# Patient Record
Sex: Male | Born: 1985 | Race: Black or African American | Hispanic: No | Marital: Single | Smoking: Current every day smoker
Health system: Southern US, Community
[De-identification: ages and names within clinical notes are randomized; demographics above are authoritative.]

---

## 2010-10-27 ENCOUNTER — Emergency Department: Payer: Self-pay | Admitting: Emergency Medicine

## 2012-07-13 ENCOUNTER — Emergency Department: Payer: Self-pay | Admitting: Emergency Medicine

## 2012-07-13 LAB — CBC
HCT: 46 %
HGB: 15.2 g/dL
MCH: 32.1 pg
MCHC: 33.1 g/dL
MCV: 97 fL
Platelet: 246 x10 3/mm 3
RBC: 4.75 x10 6/mm 3
RDW: 13.1 %
WBC: 4 x10 3/mm 3

## 2012-07-13 LAB — COMPREHENSIVE METABOLIC PANEL WITH GFR
Albumin: 4.2 g/dL
Alkaline Phosphatase: 75 U/L
Anion Gap: 5 — ABNORMAL LOW
BUN: 20 mg/dL — ABNORMAL HIGH
Bilirubin,Total: 0.3 mg/dL
Calcium, Total: 9.1 mg/dL
Chloride: 104 mmol/L
Co2: 27 mmol/L
Creatinine: 0.84 mg/dL
EGFR (African American): >60
EGFR (Non-African Amer.): >60
Glucose: 87 mg/dL
Osmolality: 274
Potassium: 3.8 mmol/L
SGOT(AST): 18 U/L
SGPT (ALT): 19 U/L
Sodium: 136 mmol/L
Total Protein: 7.8 g/dL

## 2012-07-13 LAB — URINALYSIS, COMPLETE
Bilirubin,UR: NEGATIVE
Blood: NEGATIVE
Glucose,UR: NEGATIVE mg/dL
Ketone: NEGATIVE
Leukocyte Esterase: NEGATIVE
Nitrite: NEGATIVE
Ph: 6
Protein: NEGATIVE
RBC,UR: 1 /HPF
Specific Gravity: 1.025
Squamous Epithelial: NONE SEEN
WBC UR: 1 /HPF

## 2012-07-13 LAB — LIPASE, BLOOD: Lipase: 127 U/L

## 2013-07-18 IMAGING — CT CT ABD-PELV W/ CM
1 of 3 series · 14 of 32 positions shown, 19 images · non-contrast
Comparison: none

REASON FOR EXAM: (1) abd pain; (2) abd pain
COMMENTS:

PROCEDURE:     CT  - CT ABDOMEN / PELVIS  W  - July 13, 2012 [DATE]
RESULT:     CT abdomen pelvis dated 07/13/2012.
TECHNIQUE: Helical 3 mm sections were obtained from the lung bases through
the pubic symphysis status post intravenous administration of 100 mL of
Msovue-4EE.

[Series 3: [place] · axial · 0.70mm/px · z∈[-997,-602]mm · 14 of 295 slices shown, 19 images]
[im 16/295  soft-tissue]
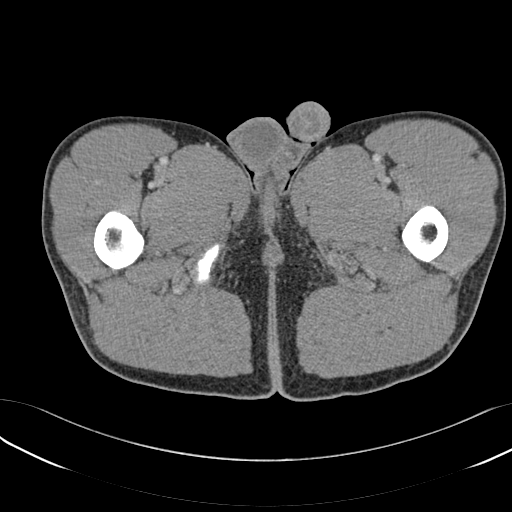
[im 16/295  bone]
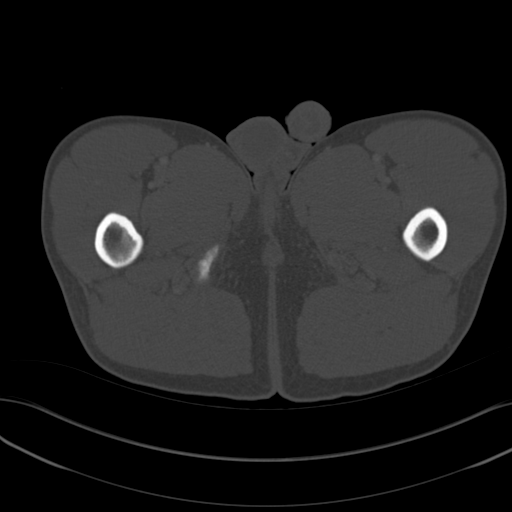
[im 47/295  soft-tissue]
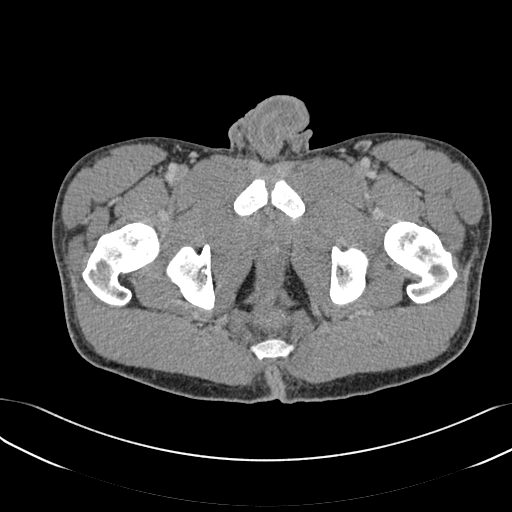
[im 62/295  soft-tissue]
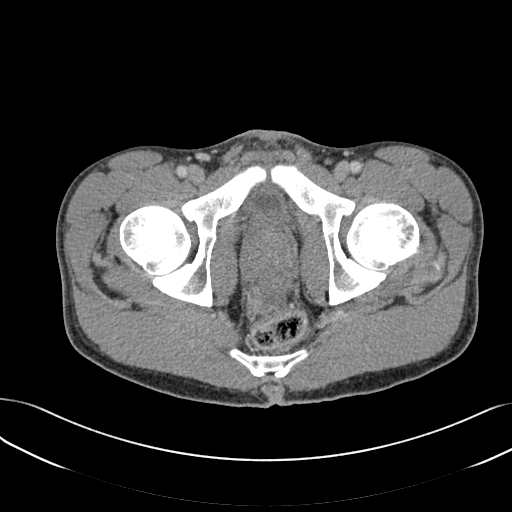
[im 78/295  soft-tissue]
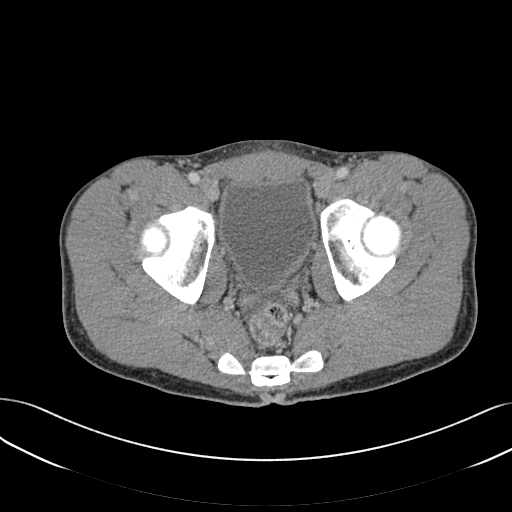
[im 109/295  soft-tissue]
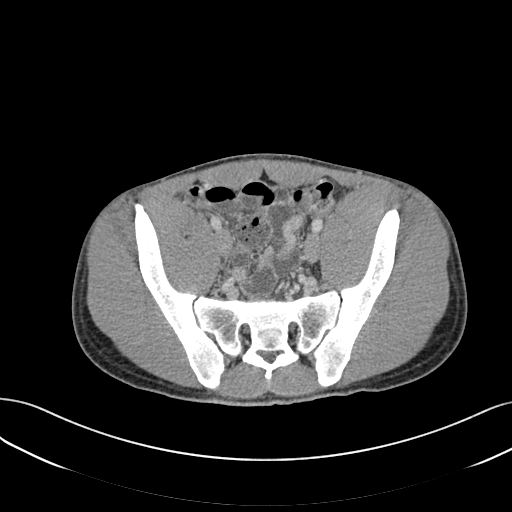
[im 124/295  soft-tissue]
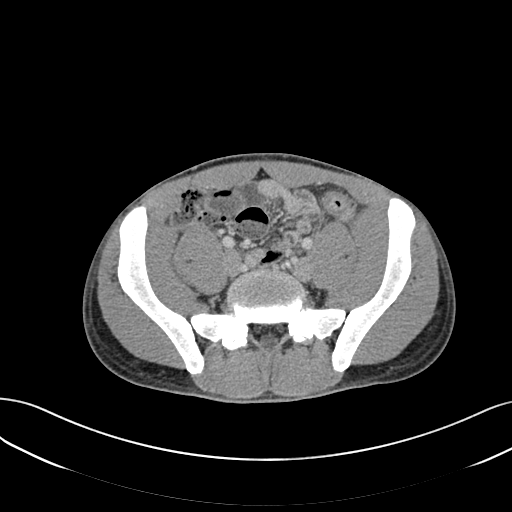
[im 155/295  soft-tissue]
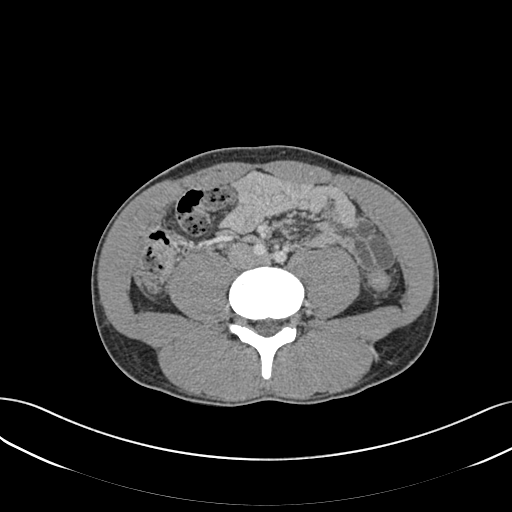
[im 171/295  soft-tissue]
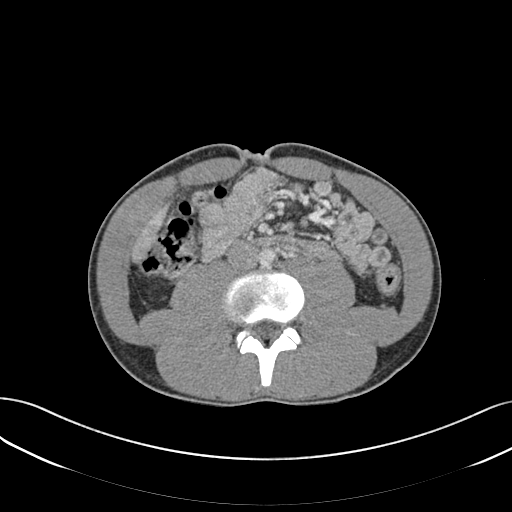
[im 186/295  soft-tissue]
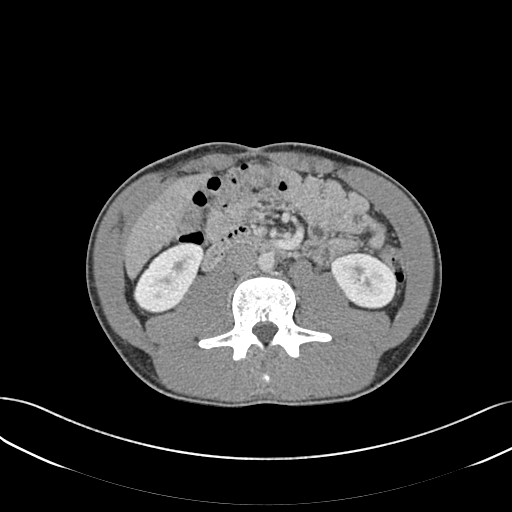
[im 186/295  bone]
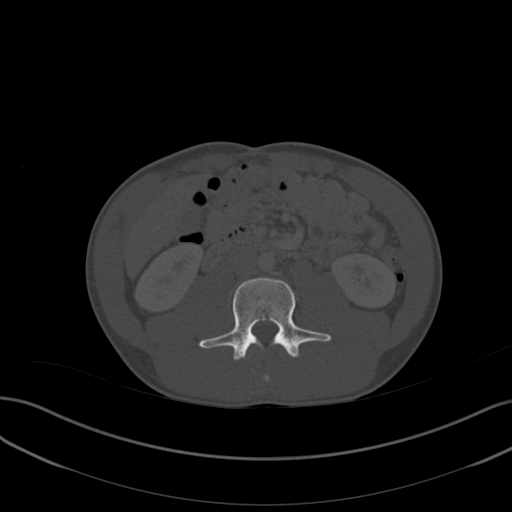
[im 217/295  soft-tissue]
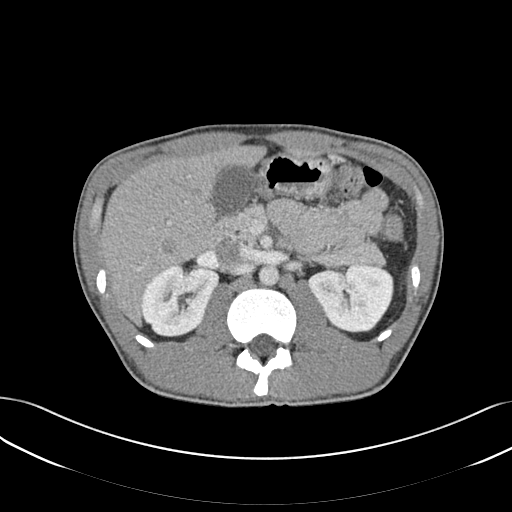
[im 233/295  soft-tissue]
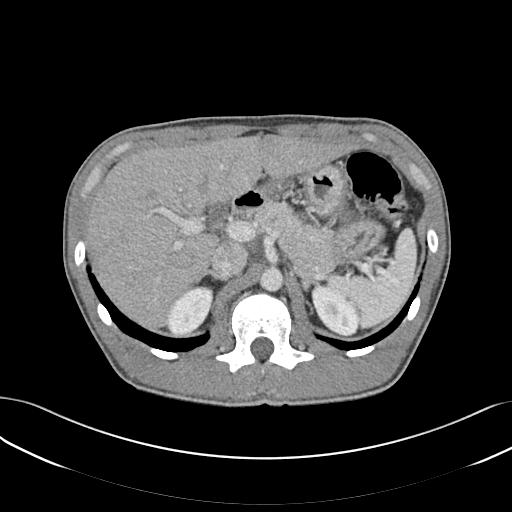
[im 233/295  lung]
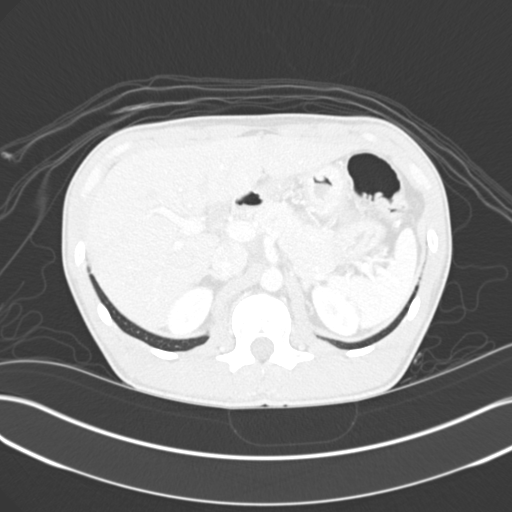
[im 248/295  soft-tissue]
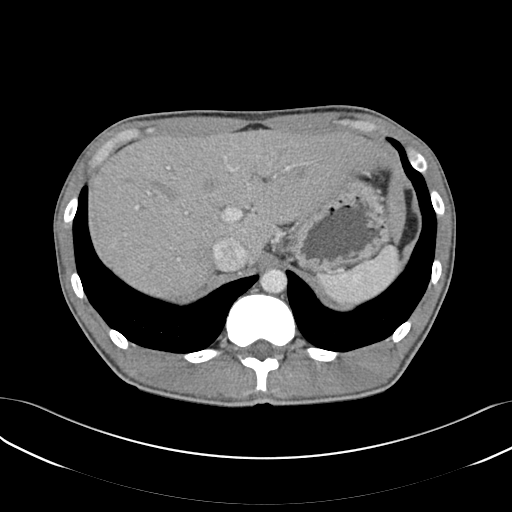
[im 248/295  lung]
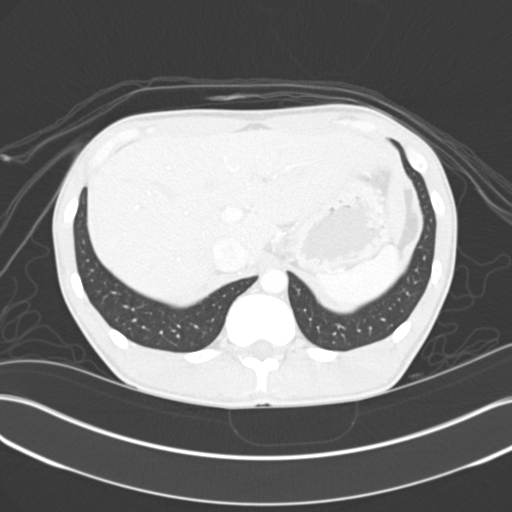
[im 264/295  lung]
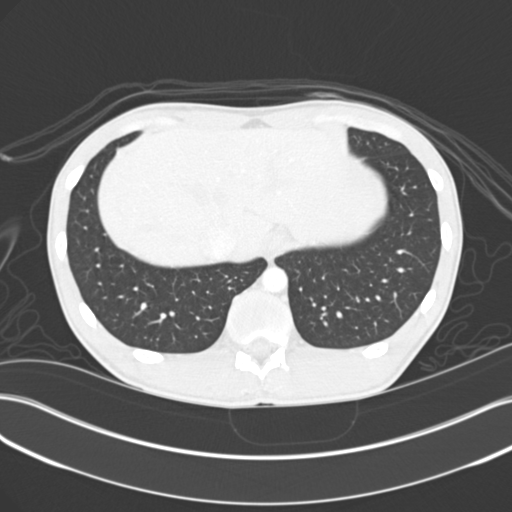
[im 279/295  soft-tissue]
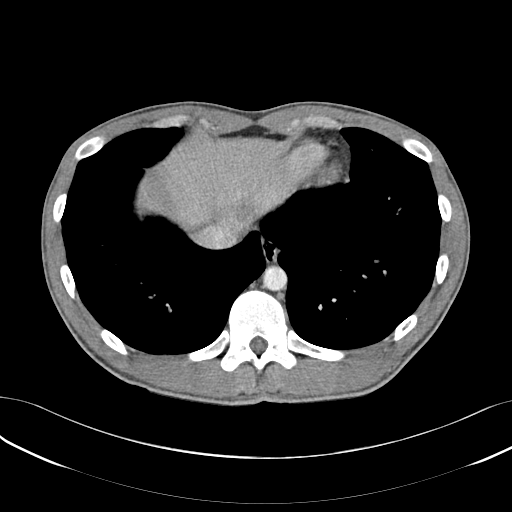
[im 279/295  lung]
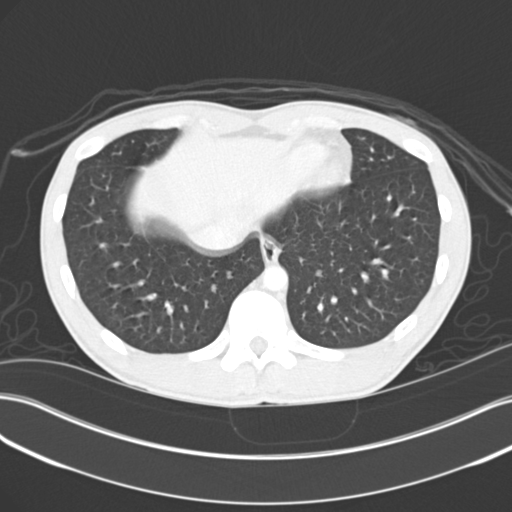

[14 of 32 positions shown; findings below may reference images not displayed]

FINDINGS: The lung bases are unremarkable.

The liver, spleen, adrenals, pancreas, kidneys are unremarkable.

There is no CT evidence of bowel obstruction.

There findings appear to represent mild diffuse bowel wall thickening thin
the descending colon. Component of this finding is likely secondary to
decompression. Mild diverticulosis identified within the descending colon.
There is mild inflammatory stranding within the surrounding mesenteric fat.
There is no evidence of abdominal or pelvic free fluid, loculated fluid
collections, or pneumoperitoneum. There is no evidence of abdominal aortic
aneurysm. The celiac, SMA, IMA, portal vein are opacified.
IMPRESSION: Findings concerning for early versus mild colitis,
nonspecific, involving the descending colon.

## 2022-03-30 ENCOUNTER — Emergency Department
Admission: EM | Admit: 2022-03-30 | Discharge: 2022-03-30 | Disposition: A | Discharge status: Home / Self Care | Payer: Self-pay | Attending: Emergency Medicine | Admitting: Emergency Medicine

## 2022-03-30 ENCOUNTER — Other Ambulatory Visit: Payer: Self-pay

## 2022-03-30 DIAGNOSIS — K029 Dental caries, unspecified: Secondary | ICD-10-CM | POA: Insufficient documentation

## 2022-03-30 MED ORDER — AMOXICILLIN 875 MG PO TABS: 875.0000 mg | ORAL_TABLET | Freq: Two times a day (BID) | ORAL | 0 refills | Status: DC

## 2022-03-30 NOTE — ED Triage Notes (Signed)
R lower dental pain x 4-5 days. Reports known cavity to area and unable to get in with dentist. Denies fevers at home.

## 2022-03-30 NOTE — ED Notes (Signed)
See triage note: pt having dental pain x4 days- pt ambulatory to treatment room, NAD noted

## 2022-03-30 NOTE — ED Provider Notes (Signed)
[facility] Provider Note    Event Date/Time   First MD Initiated Contact with Patient 03/30/22 0715     (approximate)   History   Dental Pain   HPI  Dakota Hendrix is a 36 y.o. male to the ED with complaint of dental pain.  Patient is aware that he has a cavity but states that he is unable to get in to see a dentist.     Physical Exam   Triage Vital Signs: ED Triage Vitals  Enc Vitals Group     BP 03/30/22 0553 118/71     Pulse Rate 03/30/22 0553 64     Resp 03/30/22 0553 18     Temp 03/30/22 0553 98.6 F (37 C)     Temp Source 03/30/22 0553 Oral     SpO2 03/30/22 0553 100 %     Weight 03/30/22 0551 140 lb (63.5 kg)     Height 03/30/22 0551 5\' 6"  (1.676 m)     Head Circumference --      Peak Flow --      Pain Score 03/30/22 0551 10     Pain Loc --      Pain Edu? --      Excl. in [place]? --     Most recent vital signs: Vitals:   03/30/22 0553  BP: 118/71  Pulse: 64  Resp: 18  Temp: 98.6 F (37 C)  SpO2: 100%     General: Awake, no distress.  CV:  Good peripheral perfusion.  Resp:  Normal effort.  Abd:  No distention.  Other:  Right posterior lower molars x 2 and very poor repair and hygiene.  The most proximal molar has large caries present.  The posterior molar is decayed to near the gumline.  No active drainage was noted.   ED Results / Procedures / Treatments   Labs (all labs ordered are listed, but only abnormal results are displayed) Labs Reviewed - No data to display    PROCEDURES:  Critical Care performed:   Procedures   MEDICATIONS ORDERED IN ED: Medications - No data to display   IMPRESSION / MDM / ASSESSMENT AND PLAN / ED COURSE  I reviewed the triage vital signs and the nursing notes.   Differential diagnosis includes, but is not limited to, dental pain, dental abscess, dental carry.  36 year old male presents to the ED with dental pain stating that he is unable to get an appointment with a dentist.   Patient does have 2 posterior molars on the right lower that is in need of repair.  A prescription for amoxicillin 875 twice daily for 10 days was sent to the pharmacy and a list of dental clinics in the area was also printed for him.  He is strongly encouraged to call and make an appointment at 1 of these resources.  He is instructed to take Tylenol or ibuprofen with the antibiotic if needed.      Patient's presentation is most consistent with acute, uncomplicated illness.  FINAL CLINICAL IMPRESSION(S) / ED DIAGNOSES   Final diagnoses:  Pain due to dental caries     Rx / DC Orders   ED Discharge Orders          Ordered    amoxicillin (AMOXIL) 875 MG tablet  2 times daily        03/30/22 0738             Note:  This document was prepared using Dragon voice  recognition software and may include unintentional dictation errors.   Johnn Hai, PA-C 03/30/22 1157    Blake Divine, MD 03/30/22 1451

## 2022-03-30 NOTE — Discharge Instructions (Addendum)
Follow-up with 1 of the dentist listed on your discharge papers. A prescription for antibiotics was sent to the pharmacy for you to begin taking today.  You may take Tylenol or ibuprofen with with this medication if needed and also Orajel over-the-counter for dental pain.  OPTIONS FOR DENTAL FOLLOW UP CARE  [place] [facility] - Local Safety Net Dental Clinics [email or web address]   [facility] [phone])  [facility] [phone])  [place] [phone])  [facility] [phone])  [facility] [phone]) This clinic caters to the indigent population and is on a lottery system. Location: [employer] [facility], [employer], [address] [place] Clinic Hours: Wednesdays from 6pm - 9pm, patients seen by a lottery system. For dates, call or go to [email or web address] Services: Cleanings, fillings and simple extractions. Payment Options: DENTAL WORK IS FREE OF CHARGE. Bring proof of income or support. Best way to get seen: Arrive at 5:15 pm - this is a lottery, NOT first come/first serve, so arriving earlier will not increase your chances of being seen.     [facility] [phone] Select option 1 for emergencies   Location: [facility], [place], [address], [place] Clinic Hours: No walk-ins accepted - call the day before to schedule an appointment. Check in times are 9:30 am and 1:30 pm. Services: Simple extractions, temporary fillings, pulpectomy/pulp debridement, uncomplicated abscess drainage. Payment Options: PAYMENT IS DUE AT THE TIME OF SERVICE.  Fee is usually $100-200, additional surgical procedures (e.g. abscess drainage) may be extra. Cash, checks, Visa/MasterCard accepted.  Can file Medicaid if patient is covered for dental - patient  should call case worker to check. No discount for [facility] patients. Best way to get seen: MUST call the day before and get onto the schedule. Can usually be seen the next 1-2 days. No walk-ins accepted.     [facility] [phone]   Location: [facility], [address], [place] Clinic Hours: M, W, Th, F 8am or 1:30pm, Tues 9a or 1:30 - first come/first served. Services: Simple extractions, temporary fillings, uncomplicated abscess drainage.  You do not need to be an [facility] resident. Payment Options: PAYMENT IS DUE AT THE TIME OF SERVICE. Dental insurance, otherwise sliding scale - bring proof of income or support. Depending on income and treatment needed, cost is usually $50-200. Best way to get seen: Arrive early as it is first come/first served.     [facility] [phone]   Location: [address] Clinic Hours: Mon-Thu 8a-5p Services: Most basic dental services including extractions and fillings. Payment Options: PAYMENT IS DUE AT THE TIME OF SERVICE. Sliding scale, up to 50% off - bring proof if income or support. Medicaid with dental option accepted. Best way to get seen: Call to schedule an appointment, can usually be seen within 2 weeks OR they will try to see walk-ins - show up at 8a or 2p (you may have to wait).     [facility] [phone] [place] RESIDENTS ONLY   Location: [facility], [address], [place], [place] [address] Clinic Hours: By appointment only. Monday - Thursday 8am-5pm, Friday 8am-12pm Services: Cleanings, fillings, extractions. Payment Options: PAYMENT IS DUE AT THE TIME OF SERVICE. Cash, Visa or MasterCard. Sliding scale - $30 minimum per service. Best way to get seen: Come in to office, complete  packet and make an appointment - need proof of income or support monies for each household member  and proof of [facility] residence. Usually takes about a month to get in.     [facility] [phone]   Location: [address], [place] Clinic Hours: Walk-in Urgent Care Dental Services are offered Monday-Friday mornings only. The numbers of emergencies accepted daily is limited to the number of providers available. Maximum 15 - Mondays, Wednesdays & Thursdays Maximum 10 - Tuesdays & Fridays Services: You do not need to be a [facility] resident to be seen for a dental emergency. Emergencies are defined as pain, swelling, abnormal bleeding, or dental trauma. Walkins will receive x-rays if needed. NOTE: Dental cleaning is not an emergency. Payment Options: PAYMENT IS DUE AT THE TIME OF SERVICE. Minimum co-pay is $40.00 for uninsured patients. Minimum co-pay is $3.00 for Medicaid with dental coverage. Dental Insurance is accepted and must be presented at time of visit. Medicare does not cover dental. Forms of payment: Cash, credit card, checks. Best way to get seen: If not previously registered with the clinic, walk-in dental registration begins at 7:15 am and is on a first come/first serve basis. If previously registered with the clinic, call to make an appointment.     [facility] [place] ONLY   Location: [address], [place], [place] Clinic Hours: Mon-Thu 10a-2p Services: Extractions only! Payment Options: FREE (donations accepted) - bring proof of income or support Best way to get seen: Call and schedule an appointment OR come at 8am on the 1st Monday of every month (except for holidays) when it is first come/first served.     [facility] [phone]   Location: [place], [place] Clinic Hours: Friday mornings Services, Payment Options, Best way to get seen: Call for info

## 2022-03-31 ENCOUNTER — Emergency Department
Admission: EM | Admit: 2022-03-31 | Discharge: 2022-03-31 | Disposition: A | Discharge status: Home / Self Care | Payer: Self-pay | Attending: Emergency Medicine | Admitting: Emergency Medicine

## 2022-03-31 ENCOUNTER — Other Ambulatory Visit: Payer: Self-pay

## 2022-03-31 DIAGNOSIS — X58XXXD Exposure to other specified factors, subsequent encounter: Secondary | ICD-10-CM | POA: Insufficient documentation

## 2022-03-31 DIAGNOSIS — S025XXG Fracture of tooth (traumatic), subsequent encounter for fracture with delayed healing: Secondary | ICD-10-CM | POA: Insufficient documentation

## 2022-03-31 DIAGNOSIS — K047 Periapical abscess without sinus: Secondary | ICD-10-CM | POA: Insufficient documentation

## 2022-03-31 MED ORDER — AMOXICILLIN-POT CLAVULANATE 875-125 MG PO TABS: 1.0000 | ORAL_TABLET | Freq: Two times a day (BID) | ORAL | 0 refills | Status: AC

## 2022-03-31 MED ORDER — AMOXICILLIN-POT CLAVULANATE 875-125 MG PO TABS
1.0000 | ORAL_TABLET | Freq: Once | ORAL | Status: AC
  Administered 2022-03-31: 1 via ORAL
  Filled 2022-03-31: qty 1

## 2022-03-31 MED ORDER — HYDROCODONE-ACETAMINOPHEN 5-325 MG PO TABS: 1.0000 | ORAL_TABLET | ORAL | 0 refills | Status: AC | PRN

## 2022-03-31 MED ORDER — KETOROLAC TROMETHAMINE 60 MG/2ML IM SOLN
60.0000 mg | Freq: Once | INTRAMUSCULAR | Status: AC
  Administered 2022-03-31: 60 mg via INTRAMUSCULAR
  Filled 2022-03-31: qty 2

## 2022-03-31 NOTE — ED Provider Notes (Signed)
[facility] Provider Note   Event Date/Time   First MD Initiated Contact with Patient 03/31/22 [phone]     (approximate) History  Dental Pain  HPI Dakota Hendrix is a 36 y.o. male who presents for the second time in 2 days due to demand tubular right molar pain.  Patient states that he was seen and diagnosed with a possible dental infection yesterday and started on antibiotics however patient states that after 3 doses that has not been working.  Patient states that his inflammation and pain has gotten worse over this time.  Patient has tried ibuprofen and Tylenol with no improvement in his pain. ROS: Patient currently denies any vision changes, tinnitus, difficulty speaking, facial droop, sore throat, chest pain, shortness of breath, abdominal pain, nausea/vomiting/diarrhea, dysuria, or weakness/numbness/paresthesias in any extremity   Physical Exam  Triage Vital Signs: ED Triage Vitals  Enc Vitals Group     BP 03/31/22 0545 121/86     Pulse Rate 03/31/22 0545 61     Resp 03/31/22 0545 18     Temp 03/31/22 0545 97.9 F (36.6 C)     Temp src --      SpO2 03/31/22 0545 98 %     Weight --      Height --      Head Circumference --      Peak Flow --      Pain Score 03/31/22 0543 8     Pain Loc --      Pain Edu? --      Excl. in [place]? --    Most recent vital signs: Vitals:   03/31/22 0545  BP: 121/86  Pulse: 61  Resp: 18  Temp: 97.9 F (36.6 C)  SpO2: 98%   General: Awake, oriented x4. CV:  Good peripheral perfusion.  Resp:  Normal effort.  Abd:  No distention.  Other:  Middle-aged African-American male laying in bed in mild distress secondary to pain.  There are dental caries throughout the mouth with a fractured tooth and erythematous gingiva around the back lower right 2 mandibular molars ED Results / Procedures / Treatments  PROCEDURES: Critical Care performed: No Procedures MEDICATIONS ORDERED IN ED: Medications  ketorolac (TORADOL) injection 60  mg (60 mg Intramuscular Given 03/31/22 0624)  amoxicillin-clavulanate (AUGMENTIN) 875-125 MG per tablet 1 tablet (1 tablet Oral Given 03/31/22 2297)   IMPRESSION / MDM / ASSESSMENT AND PLAN / ED COURSE  I reviewed the triage vital signs and the nursing notes.                             Patient's presentation is most consistent with acute presentation with potential threat to life or bodily function. Patient not immunosuppressed. No e/o tooth fracture, avulsion, or bleeding socket. No e/o RPA, PTA, Ludwigs angina, periapical abscess. No e/o gingival hyperplasia or concern for drug reaction.  Rx Norco, Augmentin Disposition: Discharge home. Discussed return precautions for odontogenic infections and other dental pain emergencies. Will provide dental clinic list.   FINAL CLINICAL IMPRESSION(S) / ED DIAGNOSES   Final diagnoses:  Dental infection  Open fracture of tooth with delayed healing, subsequent encounter   Rx / DC Orders   ED Discharge Orders          Ordered    amoxicillin-clavulanate (AUGMENTIN) 875-125 MG tablet  2 times daily        03/31/22 0607    HYDROcodone-acetaminophen (NORCO) 5-325 MG tablet  Every 4 hours PRN        03/31/22 0607           Note:  This document was prepared using Dragon voice recognition software and may include unintentional dictation errors.   Merwyn Katos, MD 03/31/22 [phone]

## 2022-03-31 NOTE — ED Triage Notes (Signed)
Pt was seen last night for dental pain. Sts he was given ABX but needs pain medicine.

## 2022-04-03 ENCOUNTER — Ambulatory Visit: Payer: Self-pay

## 2023-08-08 ENCOUNTER — Other Ambulatory Visit: Payer: Self-pay

## 2023-08-08 ENCOUNTER — Emergency Department
Admission: EM | Admit: 2023-08-08 | Discharge: 2023-08-08 | Discharge status: Against Medical Advice | Payer: Self-pay | Attending: Emergency Medicine | Admitting: Emergency Medicine

## 2023-08-08 DIAGNOSIS — Z5321 Procedure and treatment not carried out due to patient leaving prior to being seen by health care provider: Secondary | ICD-10-CM | POA: Insufficient documentation

## 2023-08-08 DIAGNOSIS — K0889 Other specified disorders of teeth and supporting structures: Secondary | ICD-10-CM | POA: Insufficient documentation

## 2023-08-08 NOTE — ED Triage Notes (Signed)
 Pt states dental pain for the past 3 days, pt denies fevers or chills. NAD noted.
